# Patient Record
Sex: Female | Born: 1985 | Hispanic: Yes | Marital: Single | Smoking: Never smoker
Health system: Southern US, Community
[De-identification: ages and names within clinical notes are randomized; demographics above are authoritative.]

## PROBLEM LIST (undated history)

## (undated) ENCOUNTER — Inpatient Hospital Stay (HOSPITAL_COMMUNITY): Payer: Self-pay

## (undated) DIAGNOSIS — D219 Benign neoplasm of connective and other soft tissue, unspecified: Secondary | ICD-10-CM

## (undated) DIAGNOSIS — N83209 Unspecified ovarian cyst, unspecified side: Secondary | ICD-10-CM

## (undated) DIAGNOSIS — I00 Rheumatic fever without heart involvement: Secondary | ICD-10-CM

## (undated) HISTORY — PX: DILATION AND CURETTAGE OF UTERUS: SHX78

## (undated) HISTORY — PX: MYOMECTOMY: SHX85

---

## 2013-01-20 ENCOUNTER — Inpatient Hospital Stay (HOSPITAL_COMMUNITY): Payer: Self-pay

## 2013-01-20 ENCOUNTER — Encounter (HOSPITAL_COMMUNITY): Payer: Self-pay | Admitting: *Deleted

## 2013-01-20 ENCOUNTER — Inpatient Hospital Stay (HOSPITAL_COMMUNITY)
Admission: AD | Admit: 2013-01-20 | Discharge: 2013-01-20 | Disposition: A | Discharge status: Home / Self Care | Payer: Self-pay | Source: Ambulatory Visit | Attending: Obstetrics & Gynecology | Admitting: Obstetrics & Gynecology

## 2013-01-20 DIAGNOSIS — R109 Unspecified abdominal pain: Secondary | ICD-10-CM | POA: Insufficient documentation

## 2013-01-20 DIAGNOSIS — O219 Vomiting of pregnancy, unspecified: Secondary | ICD-10-CM

## 2013-01-20 DIAGNOSIS — O210 Mild hyperemesis gravidarum: Secondary | ICD-10-CM | POA: Insufficient documentation

## 2013-01-20 DIAGNOSIS — O26899 Other specified pregnancy related conditions, unspecified trimester: Secondary | ICD-10-CM

## 2013-01-20 DIAGNOSIS — O99891 Other specified diseases and conditions complicating pregnancy: Secondary | ICD-10-CM | POA: Insufficient documentation

## 2013-01-20 HISTORY — DX: Unspecified ovarian cyst, unspecified side: N83.209

## 2013-01-20 HISTORY — DX: Benign neoplasm of connective and other soft tissue, unspecified: D21.9

## 2013-01-20 HISTORY — DX: Rheumatic fever without heart involvement: I00

## 2013-01-20 LAB — URINALYSIS, ROUTINE W REFLEX MICROSCOPIC
Bilirubin Urine: NEGATIVE
Hgb urine dipstick: NEGATIVE
Nitrite: NEGATIVE
Protein, ur: NEGATIVE mg/dL
Specific Gravity, Urine: 1.025 (ref 1.005–1.030)
Urobilinogen, UA: 1 mg/dL (ref 0.0–1.0)

## 2013-01-20 LAB — WET PREP, GENITAL: Yeast Wet Prep HPF POC: NONE SEEN

## 2013-01-20 MED ORDER — PROMETHAZINE HCL 12.5 MG PO TABS: 12.5000 mg | ORAL_TABLET | Freq: Four times a day (QID) | ORAL | Status: AC | PRN

## 2013-01-20 NOTE — MAU Note (Signed)
Pain in lower abd. No bleeding

## 2013-01-20 NOTE — MAU Provider Note (Signed)
Chief Complaint: Possible Pregnancy and Abdominal Pain   First Provider Initiated Contact with Patient 01/20/13 1338     SUBJECTIVE HPI: April Cruz is a 27 y.o. G3P1011 at [redacted]w[redacted]d by LMP who presents with 5 history of sharp, intermittent, mid suprapubic abdominal pain. The pain is constant and not related to movement. She has had dysuria and urinary frequency. No hematuria no vaginal bleeding. She has had nausea and occasional vomiting.  Past Medical History  Diagnosis Date  . Fibroid   . Rheumatic fever   . Ovarian cyst    OB History  Gravida Para Term Preterm AB SAB TAB Ectopic Multiple Living  3 1 1  0 1 1 0 0 0 1    # Outcome Date GA Lbr Len/2nd Weight Sex Delivery Anes PTL Lv  3 CUR           2 SAB           1 TRM     F SVD  N Y     Past Surgical History  Procedure Laterality Date  . Dilation and curettage of uterus    . Myomectomy     History   Social History  . Marital Status: Single    Spouse Name: N/A    Number of Children: N/A  . Years of Education: N/A   Occupational History  . Not on file.   Social History Main Topics  . Smoking status: Never Smoker   . Smokeless tobacco: Never Used  . Alcohol Use: No  . Drug Use: No  . Sexual Activity: Not on file   Other Topics Concern  . Not on file   Social History Narrative  . No narrative on file   No current facility-administered medications on file prior to encounter.   No current outpatient prescriptions on file prior to encounter.   No Known Allergies  ROS: Pertinent items in HPI  OBJECTIVE Blood pressure 117/65, pulse 58, temperature 99.2 F (37.3 C), temperature source Oral, resp. rate 18, height 5\' 1"  (1.549 m), weight 60.328 kg (133 lb), last menstrual period 11/22/2012. GENERAL: Well-developed, well-nourished female in no acute distress.  HEENT: Normocephalic HEART: normal rate RESP: normal effort ABDOMEN: Soft, non-tender EXTREMITIES: Nontender, no edema NEURO: Alert and  oriented SPECULUM EXAM: NEFG, physiologic discharge, no blood noted, cervix clean BIMANUAL: cervix L/C; uterus 6-8 wk size, no adnexal tenderness or masses  LAB RESULTS Results for orders placed during the hospital encounter of 01/20/13 (from the past 24 hour(s))  URINALYSIS, ROUTINE W REFLEX MICROSCOPIC     Status: None   Collection Time    01/20/13  1:00 PM      Result Value Range   Color, Urine YELLOW  YELLOW   APPearance CLEAR  CLEAR   Specific Gravity, Urine 1.025  1.005 - 1.030   pH 6.0  5.0 - 8.0   Glucose, UA NEGATIVE  NEGATIVE mg/dL   Hgb urine dipstick NEGATIVE  NEGATIVE   Bilirubin Urine NEGATIVE  NEGATIVE   Ketones, ur NEGATIVE  NEGATIVE mg/dL   Protein, ur NEGATIVE  NEGATIVE mg/dL   Urobilinogen, UA 1.0  0.0 - 1.0 mg/dL   Nitrite NEGATIVE  NEGATIVE   Leukocytes, UA NEGATIVE  NEGATIVE  POCT PREGNANCY, URINE     Status: Abnormal   Collection Time    01/20/13  1:04 PM      Result Value Range   Preg Test, Ur POSITIVE (*) NEGATIVE    IMAGING  Bedside US by me>  cardiac activity seen US Ob Comp Less 14 Wks  01/20/2013   OBSTETRICAL ULTRASOUND: This exam was performed within a [facility]. The OB US report was generated in the [facility] system, and faxed to the ordering physician.   This report is also available in [employer] and in the [employer]. See [facility] Obstetric US report. Viable IUP [redacted]w[redacted]d  MAU COURSE  ASSESSMENT 1. Abdominal pain complicating pregnancy   2. Nausea/vomiting in pregnancy   26 yo G3 P1011 @[redacted]w[redacted]d   PLAN Discharge home with pregnancy precautions, PNV, eligibility info    Medication List         prenatal multivitamin Tabs tablet  Take 1 tablet by mouth daily at 12 noon.     promethazine 12.5 MG tablet  Commonly known as:  PHENERGAN  Take 1 tablet (12.5 mg total) by mouth every 6 (six) hours as needed for nausea.       Follow-up Information   Schedule an appointment as soon as possible for a visit with  [facility] [place].   Contact information:   [address] [place] [address] [phone]       Danae Orleans, CNM 01/20/2013  1:52 PM

## 2013-01-20 NOTE — MAU Note (Signed)
Bad pain in lower abd past 4 days. Has had fever, when questioned further- has not checked temp, just feels cold at times.  Also having vomiting(none today, just nauseated).

## 2013-01-21 LAB — GC/CHLAMYDIA PROBE AMP
CT Probe RNA: NEGATIVE
GC Probe RNA: NEGATIVE

## 2013-03-16 ENCOUNTER — Inpatient Hospital Stay (HOSPITAL_COMMUNITY)
Admission: AD | Admit: 2013-03-16 | Discharge: 2013-03-16 | Disposition: A | Discharge status: Home / Self Care | Payer: Self-pay | Source: Ambulatory Visit | Attending: Obstetrics & Gynecology | Admitting: Obstetrics & Gynecology

## 2013-03-16 ENCOUNTER — Encounter (HOSPITAL_COMMUNITY): Payer: Self-pay | Admitting: *Deleted

## 2013-03-16 DIAGNOSIS — Y92410 Unspecified street and highway as the place of occurrence of the external cause: Secondary | ICD-10-CM | POA: Insufficient documentation

## 2013-03-16 DIAGNOSIS — R109 Unspecified abdominal pain: Secondary | ICD-10-CM | POA: Insufficient documentation

## 2013-03-16 DIAGNOSIS — O99891 Other specified diseases and conditions complicating pregnancy: Secondary | ICD-10-CM | POA: Insufficient documentation

## 2013-03-16 DIAGNOSIS — R1084 Generalized abdominal pain: Secondary | ICD-10-CM

## 2013-03-16 DIAGNOSIS — O26899 Other specified pregnancy related conditions, unspecified trimester: Secondary | ICD-10-CM

## 2013-03-16 LAB — URINALYSIS, ROUTINE W REFLEX MICROSCOPIC
Leukocytes, UA: NEGATIVE
Nitrite: NEGATIVE
Protein, ur: NEGATIVE mg/dL
Specific Gravity, Urine: 1.015 (ref 1.005–1.030)
Urobilinogen, UA: 2 mg/dL — ABNORMAL HIGH (ref 0.0–1.0)

## 2013-03-16 LAB — CBC
MCV: 90.3 fL (ref 78.0–100.0)
RBC: 3.51 MIL/uL — ABNORMAL LOW (ref 3.87–5.11)
WBC: 9.7 10*3/uL (ref 4.0–10.5)

## 2013-03-16 MED ORDER — ACETAMINOPHEN 500 MG PO TABS
1000.0000 mg | ORAL_TABLET | Freq: Once | ORAL | Status: AC
  Administered 2013-03-16: 1000 mg via ORAL
  Filled 2013-03-16: qty 2

## 2013-03-16 NOTE — MAU Provider Note (Signed)
Attestation of Attending Supervision of Advanced Practitioner (CNM/NP): Evaluation and management procedures were performed by the Advanced Practitioner under my supervision and collaboration.  I have reviewed the Advanced Practitioner's note and chart, and I agree with the management and plan.  HARRAWAY-SMITH, Jeanpaul Biehl 8:58 PM
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
## 2013-04-22 ENCOUNTER — Other Ambulatory Visit (HOSPITAL_COMMUNITY): Payer: Self-pay | Admitting: Nurse Practitioner

## 2013-04-22 DIAGNOSIS — Z3689 Encounter for other specified antenatal screening: Secondary | ICD-10-CM

## 2013-04-27 ENCOUNTER — Ambulatory Visit (HOSPITAL_COMMUNITY)
Admission: RE | Admit: 2013-04-27 | Discharge: 2013-04-27 | Disposition: A | Discharge status: Home / Self Care | Payer: Self-pay | Source: Ambulatory Visit | Attending: Nurse Practitioner | Admitting: Nurse Practitioner

## 2013-04-27 ENCOUNTER — Other Ambulatory Visit (HOSPITAL_COMMUNITY): Payer: Self-pay | Admitting: Nurse Practitioner

## 2013-04-27 DIAGNOSIS — Z3689 Encounter for other specified antenatal screening: Secondary | ICD-10-CM | POA: Insufficient documentation

## 2013-05-20 ENCOUNTER — Other Ambulatory Visit (HOSPITAL_COMMUNITY): Payer: Self-pay | Admitting: Nurse Practitioner

## 2013-05-20 DIAGNOSIS — O358XX0 Maternal care for other (suspected) fetal abnormality and damage, not applicable or unspecified: Secondary | ICD-10-CM

## 2013-06-14 ENCOUNTER — Other Ambulatory Visit: Payer: Self-pay | Admitting: Family Medicine

## 2013-06-14 DIAGNOSIS — N63 Unspecified lump in unspecified breast: Secondary | ICD-10-CM

## 2013-06-25 ENCOUNTER — Other Ambulatory Visit: Payer: Self-pay

## 2013-07-05 ENCOUNTER — Ambulatory Visit (HOSPITAL_COMMUNITY): Admission: RE | Admit: 2013-07-05 | Payer: No Typology Code available for payment source | Source: Ambulatory Visit

## 2013-11-25 ENCOUNTER — Encounter (HOSPITAL_COMMUNITY): Payer: Self-pay | Admitting: *Deleted

## 2014-03-26 IMAGING — US US OB COMP +14 WK
1 series · 12 of 28 positions shown · non-contrast
Comparison: none

[Series 1: us ob comp +14 wk · 79 acquisitions, 12 frames shown]
[im 3/79]
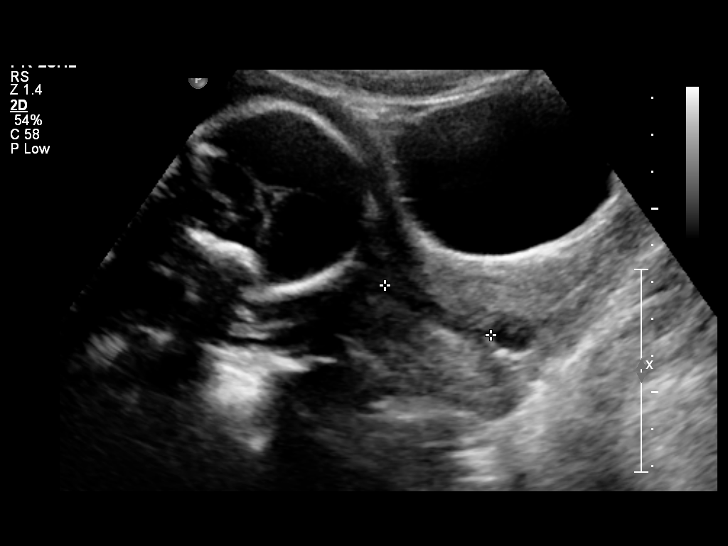
[im 9/79]
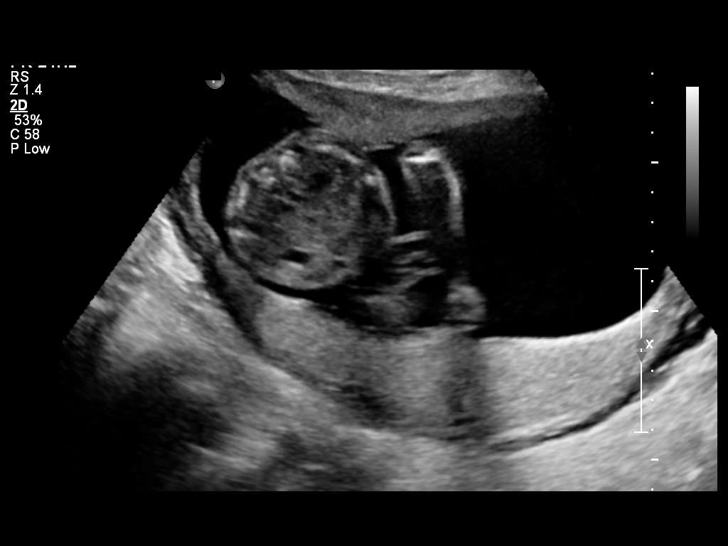
[im 15/79]
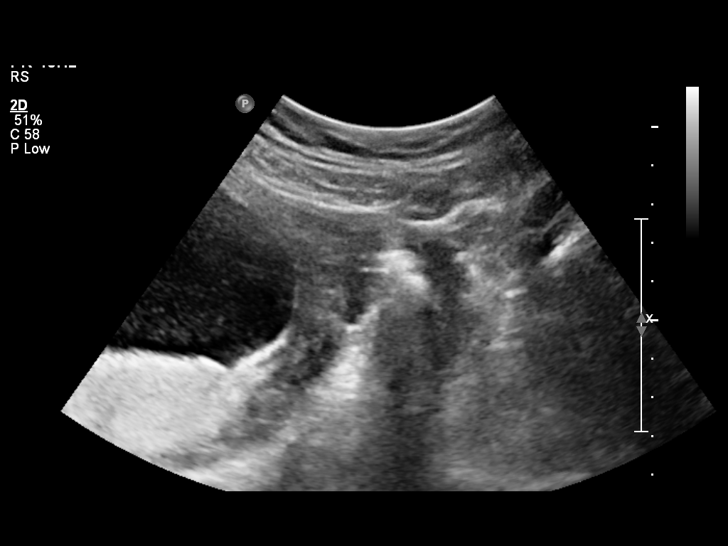
[im 24/79]
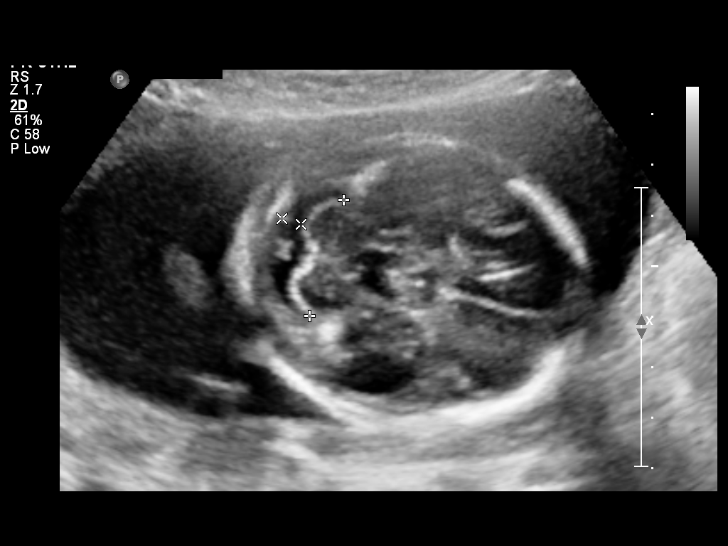
[im 29/79]
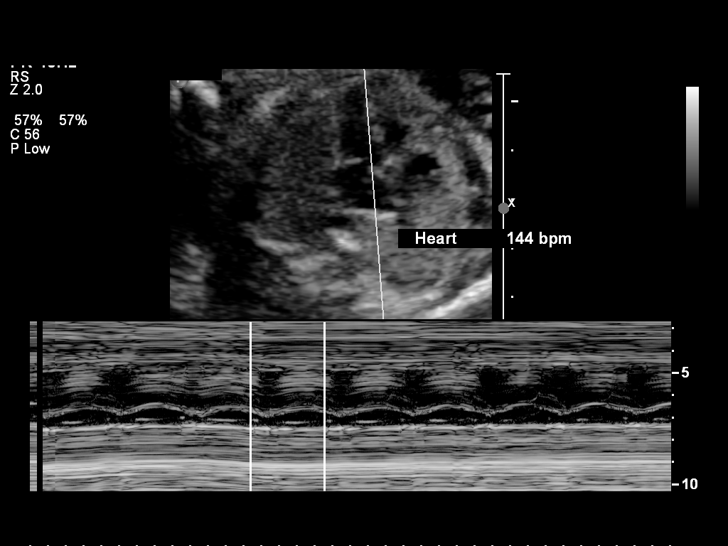
[im 35/79]
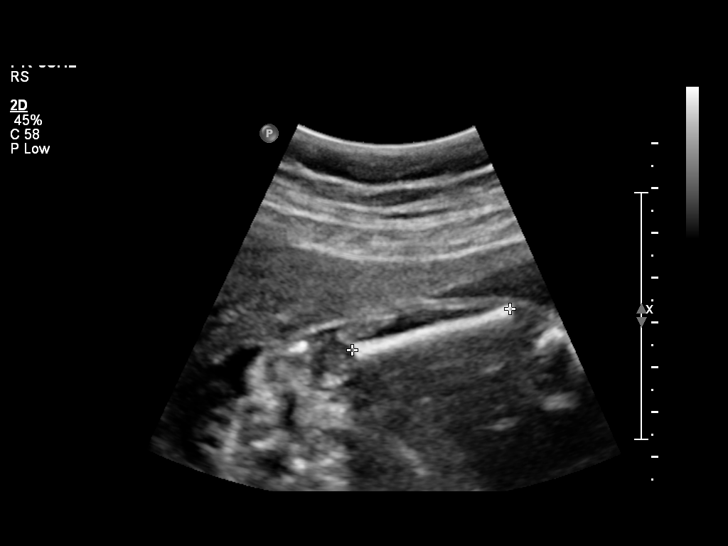
[im 44/79]
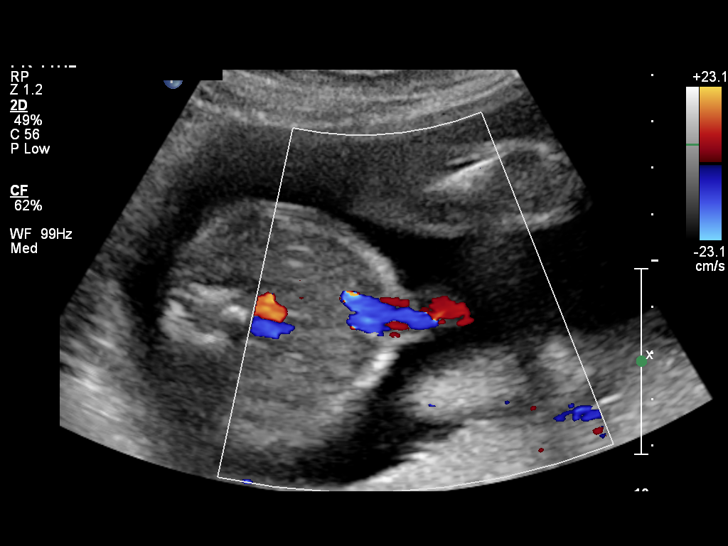
[im 50/79]
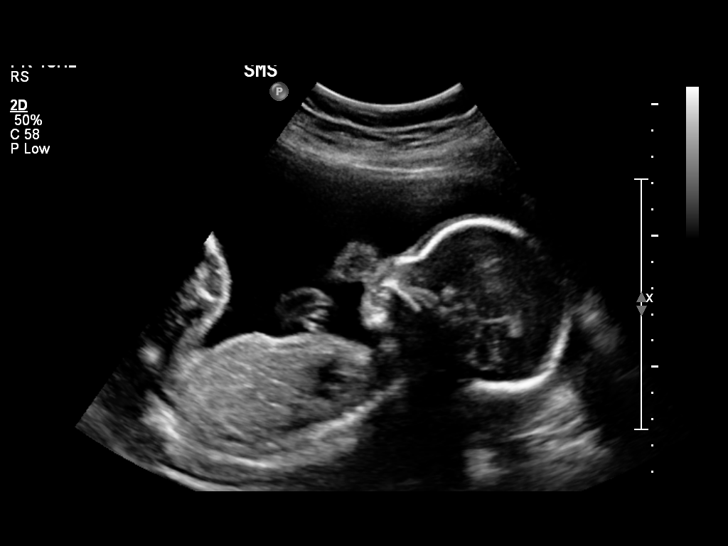
[im 55/79]
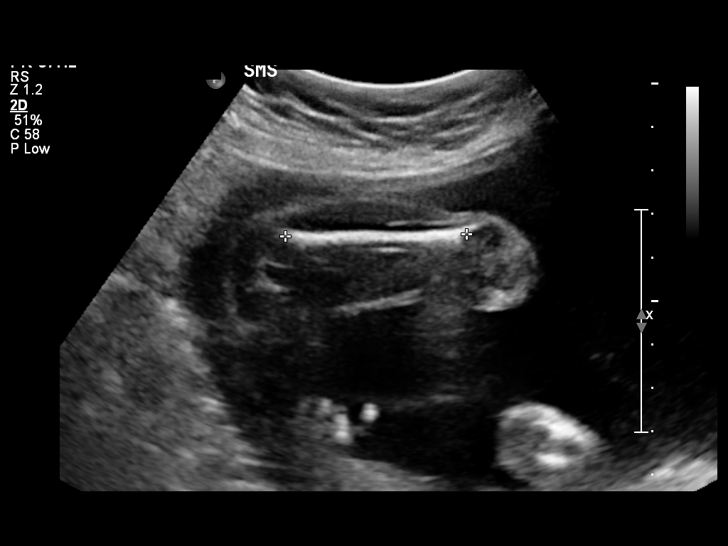
[im 64/79]
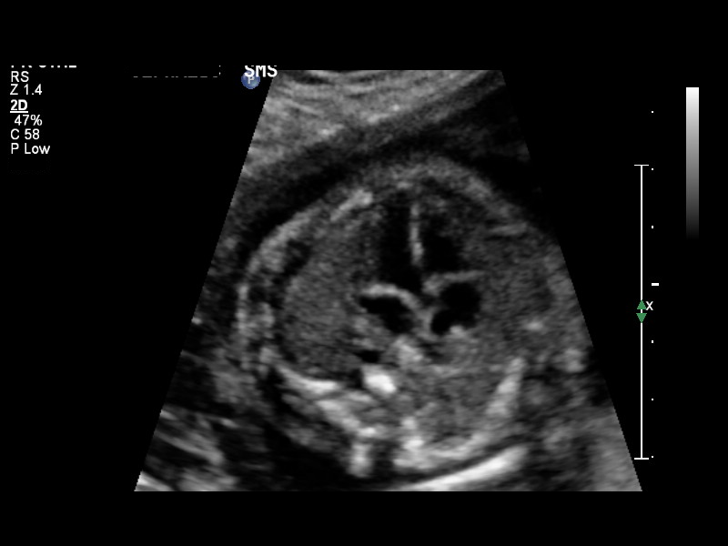
[im 70/79]
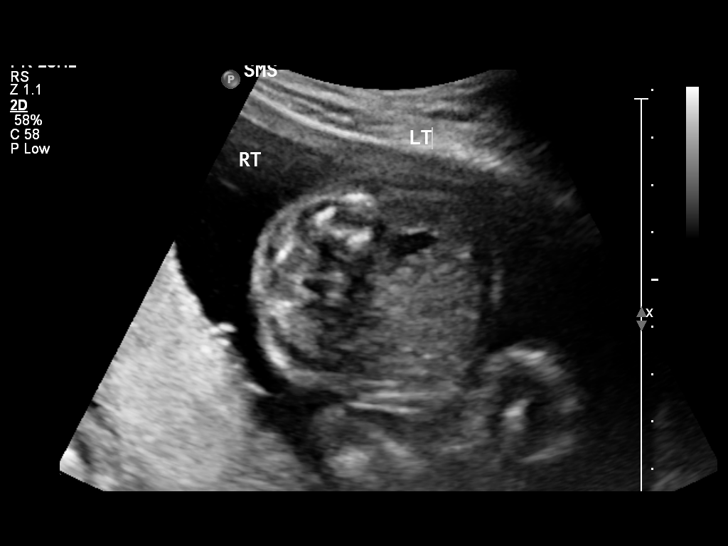
[im 76/79]
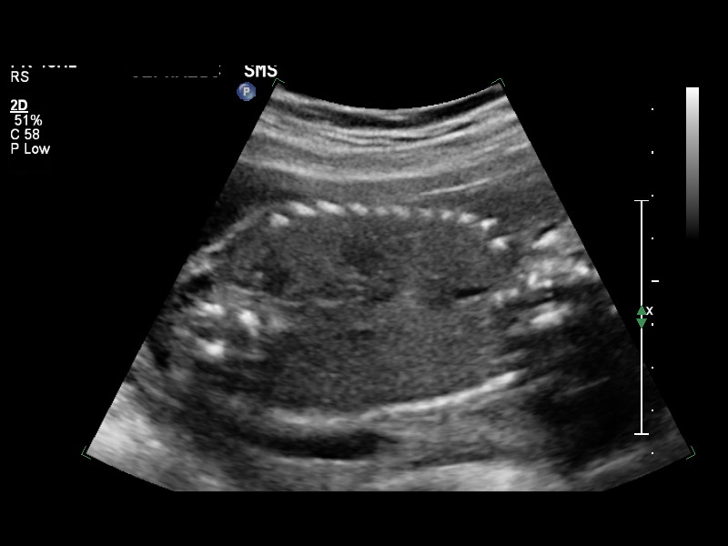

[12 of 28 positions shown; findings below may reference images not displayed]

OBSTETRICS REPORT
                      (Signed Final 04/27/2013 [DATE])

             SYBIL

Service(s) Provided

 US OB COMP + 14 WK                                    76805.1
Indications

 Basic anatomic survey
Fetal Evaluation

 Num Of Fetuses:    1
 Fetal Heart Rate:  144                          bpm
 Cardiac Activity:  Observed
 Presentation:      Cephalic
 Placenta:          Posterior, above cervical
                    os
 P. Cord            Visualized, central
 Insertion:

 Amniotic Fluid
 AFI FV:      Subjectively within normal limits
                                             Larg Pckt:    5.87  cm
 LLQ:   5.87    cm
Biometry

 BPD:     54.7  mm     G. Age:  22w 5d                CI:        70.09   70 - 86
                                                      FL/HC:      19.8   18.4 -

 HC:     208.4  mm     G. Age:  23w 0d       64  %    HC/AC:      1.14   1.06 -

 AC:     182.6  mm     G. Age:  23w 0d       68  %    FL/BPD:     75.3   71 - 87
 FL:      41.2  mm     G. Age:  23w 3d       75  %    FL/AC:      22.6   20 - 24
 HUM:     35.2  mm     G. Age:  22w 1d       43  %
 CER:     23.8  mm     G. Age:  21w 6d       43  %

 Est. FW:     568  gm      1 lb 4 oz     62  %
Gestational Age

 LMP:           22w 2d        Date:  11/22/12                 EDD:   08/29/13
 U/S Today:     23w 0d                                        EDD:   08/24/13
 Best:          22w 2d     Det. By:  LMP  (11/22/12)          EDD:   08/29/13
Anatomy
 Cranium:          Appears normal         LVOT:             Appears normal
 Fetal Cavum:      Appears normal         Diaphragm:        Appears normal
 Ventricles:       Appears normal         Stomach:          Appears normal, left
                                                            sided
 Choroid Plexus:   Appears normal         Abdomen:          Appears normal
 Cerebellum:       Appears normal         Abdominal Wall:   Appears nml (cord
                                                            insert, abd wall)
 Posterior Fossa:  Appears normal         Cord Vessels:     Appears normal (3
                                                            vessel cord)
 Nuchal Fold:      Not applicable (>20    Kidneys:          Left sided
                   wks GA)
                                                            pyelectasis
 Face:             Appears normal         Bladder:          Appears normal
                   (orbits and profile)
 Lips:             Appears normal         Spine:            Appears normal
 Heart:            Appears normal         Lower             Appears normal
                   (4CH, axis, and        Extremities:
                   situs)
 RVOT:             Appears normal         Upper             Appears normal
                                          Extremities:

 Other:  Male gender. Nasal bone visualized.
Targeted Anatomy

 Fetal Central Nervous System
 Lat. Ventricles:  4.7                    Cisterna Magna:
Cervix Uterus Adnexa

 Cervical Length:    3.17     cm

 Cervix:       Normal appearance by transabdominal scan.
 Uterus:       No abnormality visualized.
 Cul De Sac:   No free fluid seen.

 Left Ovary:    No adnexal mass visualized.
 Right Ovary:   No adnexal mass visualized.
 Adnexa:     No adnexal mass visualized.
Impression

 IUP at 22+2 weeks
 Normal detailed fetal anatomy; mild, left-side pyelectasis
 Normal amniotic fluid volume
 Measurements consistent with LMP dating; EFW at the 62nd
 %tile
Recommendations

 Follow-up ultrasound in 8-10 weeks to reassess kidneys

## 2014-04-04 ENCOUNTER — Encounter (HOSPITAL_COMMUNITY): Payer: Self-pay | Admitting: *Deleted
